# Patient Record
Sex: Male | Born: 2000 | Race: Black or African American | Hispanic: No | Marital: Single | Smoking: Never smoker
Health system: Southern US, Community
[De-identification: ages and names within clinical notes are randomized; demographics above are authoritative.]

## PROBLEM LIST (undated history)

## (undated) DIAGNOSIS — F79 Unspecified intellectual disabilities: Secondary | ICD-10-CM

## (undated) HISTORY — PX: EYE SURGERY: SHX253

---
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
## 2023-01-24 ENCOUNTER — Other Ambulatory Visit: Payer: Self-pay

## 2023-01-24 ENCOUNTER — Other Ambulatory Visit (HOSPITAL_BASED_OUTPATIENT_CLINIC_OR_DEPARTMENT_OTHER): Payer: Self-pay

## 2023-01-24 ENCOUNTER — Emergency Department (HOSPITAL_BASED_OUTPATIENT_CLINIC_OR_DEPARTMENT_OTHER)
Admission: EM | Admit: 2023-01-24 | Discharge: 2023-01-24 | Disposition: A | Discharge status: Home / Self Care | Payer: Medicare Other | Attending: Emergency Medicine | Admitting: Emergency Medicine

## 2023-01-24 ENCOUNTER — Emergency Department (HOSPITAL_BASED_OUTPATIENT_CLINIC_OR_DEPARTMENT_OTHER): Payer: Medicare Other

## 2023-01-24 DIAGNOSIS — R112 Nausea with vomiting, unspecified: Secondary | ICD-10-CM

## 2023-01-24 DIAGNOSIS — K8020 Calculus of gallbladder without cholecystitis without obstruction: Secondary | ICD-10-CM | POA: Insufficient documentation

## 2023-01-24 LAB — COMPREHENSIVE METABOLIC PANEL
ALT: 12 U/L (ref 0–44)
AST: 20 U/L (ref 15–41)
Albumin: 4.5 g/dL (ref 3.5–5.0)
Alkaline Phosphatase: 140 U/L — ABNORMAL HIGH (ref 38–126)
Anion gap: 7 (ref 5–15)
BUN: 10 mg/dL (ref 6–20)
CO2: 28 mmol/L (ref 22–32)
Calcium: 9.5 mg/dL (ref 8.9–10.3)
Chloride: 101 mmol/L (ref 98–111)
Creatinine, Ser: 0.98 mg/dL (ref 0.61–1.24)
GFR, Estimated: >60 mL/min (ref >60)
Glucose, Bld: 91 mg/dL (ref 70–99)
Potassium: 4.8 mmol/L (ref 3.5–5.1)
Sodium: 136 mmol/L (ref 135–145)
Total Bilirubin: 1.7 mg/dL — ABNORMAL HIGH (ref <1.2)
Total Protein: 7.6 g/dL (ref 6.5–8.1)

## 2023-01-24 LAB — CBC WITH DIFFERENTIAL/PLATELET
Abs Immature Granulocytes: 0.01 10*3/uL (ref 0.00–0.07)
Basophils Absolute: 0 10*3/uL (ref 0.0–0.1)
Basophils Relative: 0 %
Eosinophils Absolute: 0 10*3/uL (ref 0.0–0.5)
Eosinophils Relative: 0 %
HCT: 45.9 % (ref 39.0–52.0)
Hemoglobin: 16.1 g/dL (ref 13.0–17.0)
Immature Granulocytes: 0 %
Lymphocytes Relative: 12 %
Lymphs Abs: 0.7 10*3/uL (ref 0.7–4.0)
MCH: 30.3 pg (ref 26.0–34.0)
MCHC: 35.1 g/dL (ref 30.0–36.0)
MCV: 86.3 fL (ref 80.0–100.0)
Monocytes Absolute: 0.3 10*3/uL (ref 0.1–1.0)
Monocytes Relative: 5 %
Neutro Abs: 5.1 10*3/uL (ref 1.7–7.7)
Neutrophils Relative %: 83 %
Platelets: 240 10*3/uL (ref 150–400)
RBC: 5.32 MIL/uL (ref 4.22–5.81)
RDW: 11.7 % (ref 11.5–15.5)
WBC: 6.2 10*3/uL (ref 4.0–10.5)
nRBC: 0 % (ref 0.0–0.2)

## 2023-01-24 LAB — LIPASE, BLOOD: Lipase: 15 U/L (ref 11–51)

## 2023-01-24 MED ORDER — TRAMADOL HCL 50 MG PO TABS
50.0000 mg | ORAL_TABLET | Freq: Four times a day (QID) | ORAL | 0 refills | Status: DC | PRN
  Filled 2023-01-24: qty 15, 4d supply, fill #0

## 2023-01-24 MED ORDER — KETOROLAC TROMETHAMINE 30 MG/ML IJ SOLN
30.0000 mg | Freq: Once | INTRAMUSCULAR | Status: AC
  Administered 2023-01-24: 30 mg via INTRAVENOUS
  Filled 2023-01-24: qty 1

## 2023-01-24 MED ORDER — ONDANSETRON HCL 4 MG PO TABS
4.0000 mg | ORAL_TABLET | Freq: Four times a day (QID) | ORAL | 0 refills | Status: DC
  Filled 2023-01-24: qty 10, 3d supply, fill #0

## 2023-01-24 MED ORDER — ONDANSETRON HCL 4 MG/2ML IJ SOLN
4.0000 mg | Freq: Once | INTRAMUSCULAR | Status: AC
  Administered 2023-01-24: 4 mg via INTRAVENOUS
  Filled 2023-01-24: qty 2

## 2023-01-24 MED ORDER — ONDANSETRON HCL 4 MG/5ML PO SOLN: 4.0000 mg | Freq: Once | ORAL | 0 refills | Status: AC

## 2023-01-24 NOTE — ED Provider Notes (Signed)
[name] Provider Note   CSN: [id number] Arrival date & time: 01/24/23  1059     History  Chief Complaint  Patient presents with   Vomiting    Adam Black is a 22 y.o. male with autism presents accompanied by his mother with complaints of epigastric pain and vomiting that started this morning.  He was evaluated at urgent care this morning.  His lab work was notable for mildly elevated total bili of 1.7.  Influenza, COVID and strep all noted to be negative.  He was referred here for concern of gallbladder pathology.  Denies any cough or congestion.  No chest pain or shortness of breath.  No one at home is sick.  No diarrhea.  No prior abdominal surgeries.  HPI     Home Medications Prior to Admission medications   Medication Sig Start Date End Date Taking? Authorizing Provider  ondansetron (ZOFRAN) 4 MG tablet Take 1 tablet (4 mg total) by mouth every 6 (six) hours. 01/24/23  Yes Halford Decamp, PA-C  traMADol (ULTRAM) 50 MG tablet Take 1 tablet (50 mg total) by mouth every 6 (six) hours as needed. 01/24/23  Yes Halford Decamp, PA-C  albuterol (ACCUNEB) 0.63 MG/3ML nebulizer solution Take 1 ampule by nebulization every 6 (six) hours as needed.      [provider]  azithromycin (ZITHROMAX) 200 MG/5ML suspension 8 ml today, then 4 ml po once daily x 4 days 02/07/11   Esperanza Sheets M, PA-C  Cetirizine HCl (ZYRTEC) 5 MG/5ML SYRP Take 5 mg by mouth daily.      [provider]      Allergies    Amoxicillin-pot clavulanate    Review of Systems   Review of Systems  Gastrointestinal:  Positive for abdominal pain, nausea and vomiting.    Physical Exam Updated Vital Signs BP 120/80   Pulse 66   Temp 98.2 F (36.8 C) (Oral)   Resp 18   Ht 5\' 4"  (1.626 m)   Wt 74.8 kg   SpO2 100%   BMI 28.32 kg/m  Physical Exam Vitals and nursing note reviewed.  Constitutional:      General: He is not in acute  distress.    Appearance: He is well-developed.  HENT:     Head: Normocephalic and atraumatic.     Right Ear: Tympanic membrane normal.     Left Ear: Tympanic membrane normal.     Mouth/Throat:     Pharynx: No oropharyngeal exudate or posterior oropharyngeal erythema.  Eyes:     Conjunctiva/sclera: Conjunctivae normal.  Cardiovascular:     Rate and Rhythm: Normal rate and regular rhythm.     Heart sounds: No murmur heard. Pulmonary:     Effort: Pulmonary effort is normal. No respiratory distress.     Breath sounds: Normal breath sounds.  Abdominal:     Palpations: Abdomen is soft.     Comments: Notable tenderness to epigastric, mild periumbilical tenderness, no peritoneal signs, no rebound  Musculoskeletal:        General: No swelling.     Cervical back: Neck supple.  Skin:    General: Skin is warm and dry.     Capillary Refill: Capillary refill takes less than 2 seconds.  Neurological:     Mental Status: He is alert.  Psychiatric:        Mood and Affect: Mood normal.     ED Results / Procedures / Treatments   Labs (all labs  ordered are listed, but only abnormal results are displayed) Labs Reviewed  COMPREHENSIVE METABOLIC PANEL - Abnormal; Notable for the following components:      Result Value   Alkaline Phosphatase 140 (*)    Total Bilirubin 1.7 (*)    All other components within normal limits  CBC WITH DIFFERENTIAL/PLATELET  LIPASE, BLOOD    EKG None  Radiology US Abdomen Limited RUQ (LIVER/GB) Result Date: 01/24/2023 CLINICAL DATA:  151470 RUQ abdominal pain 151470 elevated bilirubin, sore throat, multiple vomiting episodes today. EXAM: ULTRASOUND ABDOMEN LIMITED RIGHT UPPER QUADRANT COMPARISON:  Chest XR, 03/31/2020. FINDINGS: Gallbladder: Solitary 0.5 cm dependent and mobile echogenic gallstone within a nondistended gallbladder. No pericholecystic fluid or wall thickening visualized. No sonographic Murphy sign noted by sonographer. Common bile duct: Diameter:  0.1 cm Liver: No focal lesion identified. Within normal limits in parenchymal echogenicity. Portal vein is patent on color Doppler imaging with normal direction of blood flow towards the liver. Other: 2.2 x 1.7 x 1.7 cm exophytic anechoic, thin-walled lesion with without internal vascularity at the RIGHT superior pole, consistent with a simple renal cyst. IMPRESSION: 1. Small, mobile echogenic gallstone within a nondistended gallbladder. No additional findings to suggest acute cholecystitis. This can, however, cause intermittent ball-valve obstruction and result in the patient's reported symptoms. 2. Incidental 2 cm RIGHT superior renal pole cyst. No dedicated follow-up is recommended. Electronically Signed   By: Roanna Banning M.D.   On: 01/24/2023 16:05    Procedures Procedures    Medications Ordered in ED Medications  ondansetron (ZOFRAN) injection 4 mg (4 mg Intravenous Given 01/24/23 1203)  ketorolac (TORADOL) 30 MG/ML injection 30 mg (30 mg Intravenous Given 01/24/23 1203)    ED Course/ Medical Decision Making/ A&P                                 Medical Decision Making  This patient presents to the ED with chief complaint(s) of abdominal pain and nausea and vomiting.  The complaint involves an extensive differential diagnosis and also carries with it a high risk of complications and morbidity.   pertinent past medical history as listed in HPI  The differential diagnosis includes  URI, cholecystitis, pancreatitis, appendicitis, gastroenteritis The initial plan is to  Repeat labs, ultrasound right upper quadrant, antiemetics Additional history obtained: Additional history obtained from family Records reviewed Care Everywhere/External Records  Initial Assessment:   Patient is hemodynamically stable, afebrile.  Abdominal exam notable for epigastric and periumbilical tenderness.  Referred here had a concern of gallbladder etiology given slightly elevated total bilirubin.  He has no  right lower quadrant tenderness to suggest appendicitis.  No diarrhea to suggest gastroenteritis.  No cough, congestion, ear pain to suggest any viral etiology.  Independent ECG interpretation:  None  Independent labs interpretation:  The following labs were independently interpreted:  CBC without leukocytosis  Independent visualization and interpretation of imaging: I independently visualized the following imaging with scope of interpretation limited to determining acute life threatening conditions related to emergency care: Right upper quadrant ultrasound, which revealed cholelithiasis without signs of cholecystitis or obstruction  Treatment and Reassessment: Following first assessment patient given Zofran IV 4 mg for nausea and vomiting and IV Toradol for pain.  3 PM patient reports improvement in pain and nausea.  States he would like to return home.  Abdominal exam notable for now mild epigastric and periumbilical tenderness.  Will p.o. challenge.  Upon reassessment patient has  had a successful p.o. challenge.  States he is able to return home.  Discussed ultrasound findings with patient and his mother as well as discharge plan.  They are agreeable. Consultations obtained:   none  Disposition:   Patient will be discharged home.  Referral provided for general surgery outpatient.  Prescription of tramadol and Zofran sent into pharmacy.  The patient has been appropriately medically screened and/or stabilized in the ED. I have low suspicion for any other emergent medical condition which would require further screening, evaluation or treatment in the ED or require inpatient management. At time of discharge the patient is hemodynamically stable and in no acute distress. I have discussed work-up results and diagnosis with patient and answered all questions. Patient is agreeable with discharge plan. We discussed strict return precautions for returning to the emergency department and they  verbalized understanding.     Social Determinants of Health:   none  This note was dictated with voice recognition software.  Despite best efforts at proofreading, errors may have occurred which can change the documentation meaning.          Final Clinical Impression(s) / ED Diagnoses Final diagnoses:  Nausea and vomiting, unspecified vomiting type  Calculus of gallbladder without cholecystitis without obstruction    Rx / DC Orders ED Discharge Orders          Ordered    ondansetron (ZOFRAN) 4 MG tablet  Every 6 hours        01/24/23 1710    traMADol (ULTRAM) 50 MG tablet  Every 6 hours PRN        01/24/23 1710              Fabienne Bruns 01/24/23 1711    Benjiman Core, MD 01/27/23 1441

## 2023-01-24 NOTE — ED Triage Notes (Signed)
Pt reports epigastric pain and vomiting that started this morning.  Mother states he had blood work done this AM and bilirubin was elevated.   Resp and strep was negative this AM

## 2023-01-24 NOTE — ED Notes (Addendum)
Pt given water and saltine crackers for PO challenge. Pt tolerated well.

## 2023-01-24 NOTE — Discharge Instructions (Addendum)
It was a pleasure taking care of you today.  You were evaluated the emergency room for abdominal pain and vomiting.  You are given medication for pain and nausea.  Your ultrasound showed a small stone in your gallbladder without evidence of infection of your gallbladder.  You were provided a referral for a general surgeon for evaluation please make an appointment within the next week.  A prescription for antinausea medication was sent into your pharmacy.  Please additionally use Tylenol as needed for pain.  If you experience any new or worsening symptoms including worsening abdominal pain, persistent vomiting, fevers and chills please return to the emergency room

## 2023-01-24 NOTE — ED Notes (Signed)
Reviewed discharge instructions, medications, and home care with pt and mom. Pt's mother verbalized understanding and had no further questions. Pt exited ED without complications.

## 2023-06-12 ENCOUNTER — Emergency Department (HOSPITAL_BASED_OUTPATIENT_CLINIC_OR_DEPARTMENT_OTHER)

## 2023-06-12 ENCOUNTER — Encounter (HOSPITAL_BASED_OUTPATIENT_CLINIC_OR_DEPARTMENT_OTHER): Payer: Self-pay | Admitting: Emergency Medicine

## 2023-06-12 ENCOUNTER — Emergency Department (HOSPITAL_BASED_OUTPATIENT_CLINIC_OR_DEPARTMENT_OTHER): Admitting: Radiology

## 2023-06-12 ENCOUNTER — Other Ambulatory Visit: Payer: Self-pay

## 2023-06-12 ENCOUNTER — Emergency Department (HOSPITAL_BASED_OUTPATIENT_CLINIC_OR_DEPARTMENT_OTHER)
Admission: EM | Admit: 2023-06-12 | Discharge: 2023-06-12 | Disposition: A | Discharge status: Home / Self Care | Attending: Emergency Medicine | Admitting: Emergency Medicine

## 2023-06-12 DIAGNOSIS — J45909 Unspecified asthma, uncomplicated: Secondary | ICD-10-CM | POA: Insufficient documentation

## 2023-06-12 DIAGNOSIS — R509 Fever, unspecified: Secondary | ICD-10-CM | POA: Insufficient documentation

## 2023-06-12 DIAGNOSIS — R051 Acute cough: Secondary | ICD-10-CM | POA: Diagnosis not present

## 2023-06-12 DIAGNOSIS — R1013 Epigastric pain: Secondary | ICD-10-CM | POA: Diagnosis not present

## 2023-06-12 DIAGNOSIS — R112 Nausea with vomiting, unspecified: Secondary | ICD-10-CM | POA: Insufficient documentation

## 2023-06-12 DIAGNOSIS — D72829 Elevated white blood cell count, unspecified: Secondary | ICD-10-CM | POA: Insufficient documentation

## 2023-06-12 LAB — CBC WITH DIFFERENTIAL/PLATELET
Abs Immature Granulocytes: 0.03 10*3/uL (ref 0.00–0.07)
Basophils Absolute: 0 10*3/uL (ref 0.0–0.1)
Basophils Relative: 0 %
Eosinophils Absolute: 0 10*3/uL (ref 0.0–0.5)
Eosinophils Relative: 0 %
HCT: 42.6 % (ref 39.0–52.0)
Hemoglobin: 15.1 g/dL (ref 13.0–17.0)
Immature Granulocytes: 0 %
Lymphocytes Relative: 8 %
Lymphs Abs: 1 10*3/uL (ref 0.7–4.0)
MCH: 30.7 pg (ref 26.0–34.0)
MCHC: 35.4 g/dL (ref 30.0–36.0)
MCV: 86.6 fL (ref 80.0–100.0)
Monocytes Absolute: 0.9 10*3/uL (ref 0.1–1.0)
Monocytes Relative: 8 %
Neutro Abs: 9.8 10*3/uL — ABNORMAL HIGH (ref 1.7–7.7)
Neutrophils Relative %: 84 %
Platelets: 216 10*3/uL (ref 150–400)
RBC: 4.92 MIL/uL (ref 4.22–5.81)
RDW: 12.1 % (ref 11.5–15.5)
WBC: 11.8 10*3/uL — ABNORMAL HIGH (ref 4.0–10.5)
nRBC: 0 % (ref 0.0–0.2)

## 2023-06-12 LAB — URINALYSIS, ROUTINE W REFLEX MICROSCOPIC
Bilirubin Urine: NEGATIVE
Glucose, UA: NEGATIVE mg/dL
Hgb urine dipstick: NEGATIVE
Ketones, ur: 15 mg/dL — AB
Leukocytes,Ua: NEGATIVE
Nitrite: NEGATIVE
Protein, ur: NEGATIVE mg/dL
Specific Gravity, Urine: 1.015 (ref 1.005–1.030)
pH: 6.5 (ref 5.0–8.0)

## 2023-06-12 LAB — COMPREHENSIVE METABOLIC PANEL WITH GFR
ALT: 13 U/L (ref 0–44)
AST: 25 U/L (ref 15–41)
Albumin: 4.5 g/dL (ref 3.5–5.0)
Alkaline Phosphatase: 133 U/L — ABNORMAL HIGH (ref 38–126)
Anion gap: 12 (ref 5–15)
BUN: 8 mg/dL (ref 6–20)
CO2: 24 mmol/L (ref 22–32)
Calcium: 9.5 mg/dL (ref 8.9–10.3)
Chloride: 99 mmol/L (ref 98–111)
Creatinine, Ser: 1.06 mg/dL (ref 0.61–1.24)
GFR, Estimated: >60 mL/min (ref >60)
Glucose, Bld: 98 mg/dL (ref 70–99)
Potassium: 4.3 mmol/L (ref 3.5–5.1)
Sodium: 135 mmol/L (ref 135–145)
Total Bilirubin: 1.2 mg/dL (ref 0.0–1.2)
Total Protein: 7.1 g/dL (ref 6.5–8.1)

## 2023-06-12 LAB — RESP PANEL BY RT-PCR (RSV, FLU A&B, COVID)  RVPGX2
Influenza A by PCR: NEGATIVE
Influenza B by PCR: NEGATIVE
Resp Syncytial Virus by PCR: NEGATIVE
SARS Coronavirus 2 by RT PCR: NEGATIVE

## 2023-06-12 LAB — LIPASE, BLOOD: Lipase: 14 U/L (ref 11–51)

## 2023-06-12 MED ORDER — BENZONATATE 100 MG PO CAPS
100.0000 mg | ORAL_CAPSULE | Freq: Once | ORAL | Status: DC
  Filled 2023-06-12: qty 1

## 2023-06-12 MED ORDER — GUAIFENESIN 100 MG/5ML PO LIQD
5.0000 mL | Freq: Once | ORAL | Status: AC
  Administered 2023-06-12: 5 mL via ORAL
  Filled 2023-06-12: qty 10

## 2023-06-12 MED ORDER — ACETAMINOPHEN 160 MG/5ML PO SOLN
650.0000 mg | Freq: Once | ORAL | Status: AC
Start: 2023-06-12 — End: 2023-06-12
  Administered 2023-06-12: 650 mg via ORAL
  Filled 2023-06-12: qty 20.3

## 2023-06-12 MED ORDER — ONDANSETRON 4 MG PO TBDP: 4.0000 mg | ORAL_TABLET | Freq: Three times a day (TID) | ORAL | 0 refills | Status: AC | PRN

## 2023-06-12 NOTE — ED Provider Notes (Signed)
 [place] EM[facility]GENCY DEPARTMENT AT [facility] Provider Note   CSN: [id number] Arrival date & time: 06/12/23  1551     History  Chief Complaint  Patient presents with   Abdominal Pain    Adam Black is a 23 y.o. male.  HPI     23 year old male with a history of cerebral palsy, intellectual disability (mother has guardianship), cholelithiasis, who presents with concern for cough and abdominal pain.  Coughing, classmates have been sick too. 3-4 days ago began.  Gave him mucinex  this morning Vomited 3 times today Then saying having RUQ abdominal pain, headache, chills 100.3 temperature at UC No diarrhea or constipation No difficulty breathing No chest pain Epigastric abdominal pain No pain or swelling in legs Some congestion with cough Does have asthma but not wheezing No problems urinating  He is ambulatory. Not bed ridden, no immobilization, recent surgeries, no hx of DVT or PE. No hemoptysis  Past Medical History:  Diagnosis Date   Asthma    Mental retardation    Past Surgical History:  Procedure Laterality Date   EYE SURGERY        Home Medications Prior to Admission medications   Medication Sig Start Date End Date Taking? Authorizing Provider  ondansetron  (ZOFRAN -ODT) 4 MG disintegrating tablet Take 1 tablet (4 mg total) by mouth every 8 (eight) hours as needed for nausea or vomiting. 06/12/23  Yes Scarlette Currier, MD  albuterol  (ACCUNEB ) 0.63 MG/3ML nebulizer solution Take 1 ampule by nebulization every 6 (six) hours as needed.      [provider]  azithromycin  (ZITHROMAX ) 200 MG/5ML suspension 8 ml today, then 4 ml po once daily x 4 days 02/07/11   Cindra Cree M, PA-C  Cetirizine HCl (ZYRTEC) 5 MG/5ML SYRP Take 5 mg by mouth daily.      [provider]      Allergies    Amoxicillin-pot clavulanate    Review of Systems   Review of Systems  Physical Exam Updated Vital Signs BP 115/82   Pulse (!) 102   Temp (!) 101 F  (38.3 C) (Oral)   Resp 20   Ht 5\' 4"  (1.626 m)   Wt 75.8 kg   SpO2 100%   BMI 28.67 kg/m  Physical Exam Vitals and nursing note reviewed.  Constitutional:      General: He is not in acute distress.    Appearance: He is well-developed. He is not diaphoretic.  HENT:     Head: Normocephalic and atraumatic.  Eyes:     Conjunctiva/sclera: Conjunctivae normal.  Cardiovascular:     Rate and Rhythm: Normal rate and regular rhythm.     Heart sounds: Normal heart sounds. No murmur heard.    No friction rub. No gallop.  Pulmonary:     Effort: Pulmonary effort is normal. No respiratory distress.     Breath sounds: Normal breath sounds. No wheezing or rales.  Abdominal:     General: There is no distension.     Palpations: Abdomen is soft.     Tenderness: There is abdominal tenderness in the right upper quadrant and epigastric area. There is no guarding. Negative signs include McBurney's sign. Murphy's sign: reports tenderness but not techincally positive. Musculoskeletal:     Cervical back: Normal range of motion.  Skin:    General: Skin is warm and dry.  Neurological:     Mental Status: He is alert and oriented to person, place, and time.     ED Results / Procedures /  Treatments   Labs (all labs ordered are listed, but only abnormal results are displayed) Labs Reviewed  CBC WITH DIFFERENTIAL/PLATELET - Abnormal; Notable for the following components:      Result Value   WBC 11.8 (*)    Neutro Abs 9.8 (*)    All other components within normal limits  COMPREHENSIVE METABOLIC PANEL WITH GFR - Abnormal; Notable for the following components:   Alkaline Phosphatase 133 (*)    All other components within normal limits  URINALYSIS, ROUTINE W REFLEX MICROSCOPIC - Abnormal; Notable for the following components:   Ketones, ur 15 (*)    All other components within normal limits  RESP PANEL BY RT-PCR (RSV, FLU A&B, COVID)  RVPGX2  LIPASE, BLOOD    EKG EKG  Interpretation Date/Time:  Thursday Jun 12 2023 16:18:07 EDT Ventricular Rate:  89 PR Interval:  146 QRS Duration:  86 QT Interval:  342 QTC Calculation: 417 R Axis:   107  Text Interpretation: Sinus rhythm Borderline right axis deviation Borderline ST elevation, anterolateral leads No significant change since last tracing Confirmed by Scarlette Currier ([id number]) on 06/12/2023 4:56:08 PM  Radiology US  Abdomen Limited RUQ (LIVER/GB) Result Date: 06/12/2023 CLINICAL DATA:  151470 RUQ abdominal pain 151470 EXAM: ULTRASOUND ABDOMEN LIMITED RIGHT UPPER QUADRANT COMPARISON:  Ultrasound abdomen 01/24/2023 FINDINGS: Gallbladder: Mobile gallstone within the gallbladder lumen. No gallbladder wall thickening or pericholecystic visualized. No sonographic Murphy sign noted by sonographer. Common bile duct: Diameter: 3 mm Liver: No focal lesion identified. Within normal limits in parenchymal echogenicity. Portal vein is patent on color Doppler imaging with normal direction of blood flow towards the liver. Other: None. IMPRESSION: Cholelithiasis with no acute cholecystitis. Electronically Signed   By: Morgane  Naveau M.D.   On: 06/12/2023 20:25   DG Chest 2 View Result Date: 06/12/2023 CLINICAL DATA:  Cough. EXAM: CHEST - 2 VIEW COMPARISON:  March 31, 2020. FINDINGS: The heart size and mediastinal contours are within normal limits. Both lungs are clear. The visualized skeletal structures are unremarkable. IMPRESSION: No active cardiopulmonary disease. Electronically Signed   By: Rosalene Colon M.D.   On: 06/12/2023 17:28    Procedures Procedures    Medications Ordered in ED Medications  benzonatate  (TESSALON ) capsule 100 mg (100 mg Oral Not Given 06/12/23 1750)  guaiFENesin  (ROBITUSSIN) 100 MG/5ML liquid 5 mL (5 mLs Oral Given 06/12/23 1753)  acetaminophen  (TYLENOL ) 160 MG/5ML solution 650 mg (650 mg Oral Given 06/12/23 2059)    ED Course/ Medical Decision Making/ A&P                                   23 year old male with a history of cerebral palsy, intellectual disability (mother has guardianship), cholelithiasis, who presents with concern for cough and abdominal pain.  Differential diagnosis includes viral respiratory infection, pneumonia, PE, pancreatitis, hepatitis, cholecystitis, gastritis, peptic ulcer disease, pyelonephritis.  Labs completed and personally evaluated interpreted by me show no signs of pancreatitis, hepatitis, mild leukocytosis.  Urinalysis is without urinary tract infection.  COVID, flu and RSV testing are negative.    Chest x-ray completed and personally by me and radiology shows no evidence of pneumonia, pneumothorax, or pulmonary edema.   Right upper quadrant ultrasound completed showing cholelithiasis without cholecystitis.  Discussed that with sick contacts at school with cough, it is possible he has a viral infection with cough, fever, nausea, vomiting and abdominal discomfort from gastritis or muscular pain from coughing.  Also discussed ddx does also include occult pneumonia, PE (although low suspicion with him being PERC negative/low risk [place]) or early cholecystitis (although no GB wall thickening, no significant tenderness on exam, no pericholecystic fluid).  Discussed we can obtain CTA PE study, ct abdomen pelvis and or discuss with general surgery for evaluation, however in setting of cough/sick contacts can continue supportive care at this time and return for new or worsening symptoms. Agree to conservative treatment at this time. Given zofran  for nausea, she will continue OTC cough medications.   Discussed very strict return precautions.  Patient discharged in stable condition with understanding of reasons to return.         Final Clinical Impression(s) / ED Diagnoses Final diagnoses:  Acute cough  Fever, unspecified fever cause  Nausea and vomiting, unspecified vomiting type  Epigastric pain    Rx / DC Orders ED Discharge Orders           Ordered    ondansetron  (ZOFRAN -ODT) 4 MG disintegrating tablet  Every 8 hours PRN        06/12/23 2101              Scarlette Currier, MD 06/12/23 2104

## 2023-06-12 NOTE — ED Triage Notes (Addendum)
 Pt alert, ambulatory, NAD from UC c/o RUQ and epigastric abd pain with N/V and chills that started today, pt reports non-productive cough x3 days.   Zofran  admin at [facility].

## 2023-06-12 NOTE — ED Notes (Signed)
 Mother asking when they can leave, explained that waiting on US  results. Edp schlossman notified

## 2023-06-12 NOTE — ED Notes (Signed)

## 2023-06-12 NOTE — Discharge Instructions (Addendum)
 Can take mucinex  at 10PM (after cough medication here wears off.)

## 2023-06-14 ENCOUNTER — Emergency Department (HOSPITAL_BASED_OUTPATIENT_CLINIC_OR_DEPARTMENT_OTHER): Admitting: Radiology

## 2023-06-14 ENCOUNTER — Other Ambulatory Visit: Payer: Self-pay

## 2023-06-14 ENCOUNTER — Emergency Department (HOSPITAL_BASED_OUTPATIENT_CLINIC_OR_DEPARTMENT_OTHER)
Admission: EM | Admit: 2023-06-14 | Discharge: 2023-06-14 | Disposition: A | Discharge status: Home / Self Care | Attending: Emergency Medicine | Admitting: Emergency Medicine

## 2023-06-14 ENCOUNTER — Encounter (HOSPITAL_BASED_OUTPATIENT_CLINIC_OR_DEPARTMENT_OTHER): Payer: Self-pay

## 2023-06-14 DIAGNOSIS — J168 Pneumonia due to other specified infectious organisms: Secondary | ICD-10-CM | POA: Diagnosis not present

## 2023-06-14 DIAGNOSIS — J45909 Unspecified asthma, uncomplicated: Secondary | ICD-10-CM | POA: Insufficient documentation

## 2023-06-14 DIAGNOSIS — R059 Cough, unspecified: Secondary | ICD-10-CM | POA: Diagnosis present

## 2023-06-14 DIAGNOSIS — J189 Pneumonia, unspecified organism: Secondary | ICD-10-CM

## 2023-06-14 LAB — BASIC METABOLIC PANEL WITH GFR
Anion gap: 10 (ref 5–15)
BUN: 10 mg/dL (ref 6–20)
CO2: 28 mmol/L (ref 22–32)
Calcium: 9.5 mg/dL (ref 8.9–10.3)
Chloride: 100 mmol/L (ref 98–111)
Creatinine, Ser: 1.18 mg/dL (ref 0.61–1.24)
GFR, Estimated: >60 mL/min (ref >60)
Glucose, Bld: 96 mg/dL (ref 70–99)
Potassium: 4.3 mmol/L (ref 3.5–5.1)
Sodium: 139 mmol/L (ref 135–145)

## 2023-06-14 LAB — CBC WITH DIFFERENTIAL/PLATELET
Abs Immature Granulocytes: 0.02 10*3/uL (ref 0.00–0.07)
Basophils Absolute: 0 10*3/uL (ref 0.0–0.1)
Basophils Relative: 1 %
Eosinophils Absolute: 0.1 10*3/uL (ref 0.0–0.5)
Eosinophils Relative: 2 %
HCT: 44.5 % (ref 39.0–52.0)
Hemoglobin: 15.6 g/dL (ref 13.0–17.0)
Immature Granulocytes: 0 %
Lymphocytes Relative: 12 %
Lymphs Abs: 0.9 10*3/uL (ref 0.7–4.0)
MCH: 30.9 pg (ref 26.0–34.0)
MCHC: 35.1 g/dL (ref 30.0–36.0)
MCV: 88.1 fL (ref 80.0–100.0)
Monocytes Absolute: 1.1 10*3/uL — ABNORMAL HIGH (ref 0.1–1.0)
Monocytes Relative: 15 %
Neutro Abs: 5.2 10*3/uL (ref 1.7–7.7)
Neutrophils Relative %: 70 %
Platelets: 201 10*3/uL (ref 150–400)
RBC: 5.05 MIL/uL (ref 4.22–5.81)
RDW: 12.2 % (ref 11.5–15.5)
WBC: 7.4 10*3/uL (ref 4.0–10.5)
nRBC: 0 % (ref 0.0–0.2)

## 2023-06-14 LAB — RESP PANEL BY RT-PCR (RSV, FLU A&B, COVID)  RVPGX2
Influenza A by PCR: NEGATIVE
Influenza B by PCR: NEGATIVE
Resp Syncytial Virus by PCR: NEGATIVE
SARS Coronavirus 2 by RT PCR: NEGATIVE

## 2023-06-14 MED ORDER — CEFDINIR 250 MG/5ML PO SUSR: 300.0000 mg | Freq: Two times a day (BID) | ORAL | 0 refills | Status: AC

## 2023-06-14 MED ORDER — AZITHROMYCIN 250 MG PO TABS: 250.0000 mg | ORAL_TABLET | Freq: Every day | ORAL | 0 refills | Status: DC

## 2023-06-14 MED ORDER — CEFDINIR 300 MG PO CAPS: 300.0000 mg | ORAL_CAPSULE | Freq: Two times a day (BID) | ORAL | 0 refills | Status: DC

## 2023-06-14 MED ORDER — BENZONATATE 100 MG PO CAPS: 100.0000 mg | ORAL_CAPSULE | Freq: Three times a day (TID) | ORAL | 0 refills | Status: DC | PRN

## 2023-06-14 MED ORDER — AZITHROMYCIN 200 MG/5ML PO SUSR
250.0000 mg | Freq: Every day | ORAL | 0 refills | Status: AC
Start: 2023-06-14 — End: 2023-06-19

## 2023-06-14 MED ORDER — PROMETHAZINE-DM 6.25-15 MG/5ML PO SYRP: 2.5000 mL | ORAL_SOLUTION | Freq: Four times a day (QID) | ORAL | 0 refills | Status: AC | PRN

## 2023-06-14 NOTE — ED Provider Notes (Signed)
 [place] EMERGENCY DEPARTMENT AT [facility] Provider Note   CSN: [id number] Arrival date & time: 06/14/23  5643     History  Chief Complaint  Patient presents with   Fever    Riggs Britnell is a 23 y.o. male.   Fever   23 year old male presents emergency department accompanied by mother with complaints of cough, fever.  Patient has been ill with symptoms since around Tuesday this week.  Was seen in the ED 2 days ago and had reassuring workup including chest x-ray, labs, right upper quadrant ultrasound.  Did have mild leukocytosis at that time.  Mother reports patient continued with fever, cough, nasal congestion.  States that abdominal pain as well as nausea has completely resolved.  Presents due to continued symptoms.  Has been trying over-the-counter medications without significant improvement.  Last documented fever last night 101.  No fever this morning without antipyretic administered.  Past medical history significant for asthma, cerebral palsy, intellectual disability, cholelithiasis  Home Medications Prior to Admission medications   Medication Sig Start Date End Date Taking? Authorizing Provider  azithromycin  (ZITHROMAX ) 200 MG/5ML suspension Take 6.3 mLs (250 mg total) by mouth daily for 5 days. Take 500mg  (12.6 mg) first day and then 250 mg (6.3 mg) for the next four days. 06/14/23 06/19/23 Yes Neil Balls A, PA  cefdinir (OMNICEF) 250 MG/5ML suspension Take 6 mLs (300 mg total) by mouth 2 (two) times daily for 5 days. 06/14/23 06/19/23 Yes Neil Balls A, PA  promethazine-dextromethorphan (PROMETHAZINE-DM) 6.25-15 MG/5ML syrup Take 2.5 mLs by mouth 4 (four) times daily as needed for cough. 06/14/23  Yes Neil Balls A, PA  albuterol  (ACCUNEB ) 0.63 MG/3ML nebulizer solution Take 1 ampule by nebulization every 6 (six) hours as needed.      [provider]  Cetirizine HCl (ZYRTEC) 5 MG/5ML SYRP Take 5 mg by mouth daily.      [provider]   ondansetron  (ZOFRAN -ODT) 4 MG disintegrating tablet Take 1 tablet (4 mg total) by mouth every 8 (eight) hours as needed for nausea or vomiting. 06/12/23   Scarlette Currier, MD      Allergies    Amoxicillin-pot clavulanate    Review of Systems   Review of Systems  Constitutional:  Positive for fever.  All other systems reviewed and are negative.   Physical Exam Updated Vital Signs BP 131/85 (BP Location: Right Arm)   Pulse 92   Temp 99 F (37.2 C) (Oral)   Resp 18   Wt 75.8 kg   SpO2 97%   BMI 28.67 kg/m  Physical Exam Vitals and nursing note reviewed.  Constitutional:      General: He is not in acute distress.    Appearance: He is well-developed.  HENT:     Head: Normocephalic and atraumatic.     Right Ear: Tympanic membrane normal.     Left Ear: Tympanic membrane normal.     Nose: Congestion and rhinorrhea present.     Mouth/Throat:     Mouth: Mucous membranes are moist.     Pharynx: Oropharynx is clear.  Eyes:     Conjunctiva/sclera: Conjunctivae normal.  Cardiovascular:     Rate and Rhythm: Normal rate and regular rhythm.     Heart sounds: No murmur heard. Pulmonary:     Effort: Pulmonary effort is normal. No respiratory distress.     Breath sounds: Rales present.  Abdominal:     Palpations: Abdomen is soft.     Tenderness: There is no abdominal  tenderness. There is no guarding.  Musculoskeletal:        General: No swelling.     Cervical back: Neck supple.  Skin:    General: Skin is warm and dry.     Capillary Refill: Capillary refill takes less than 2 seconds.  Neurological:     Mental Status: He is alert.  Psychiatric:        Mood and Affect: Mood normal.     ED Results / Procedures / Treatments   Labs (all labs ordered are listed, but only abnormal results are displayed) Labs Reviewed  CBC WITH DIFFERENTIAL/PLATELET - Abnormal; Notable for the following components:      Result Value   Monocytes Absolute 1.1 (*)    All other components within  normal limits  RESP PANEL BY RT-PCR (RSV, FLU A&B, COVID)  RVPGX2  BASIC METABOLIC PANEL WITH GFR    EKG None  Radiology DG Chest 2 View Result Date: 06/14/2023 CLINICAL DATA:  23 year old male with fever and cough starting 3 days ago. EXAM: CHEST - 2 VIEW COMPARISON:  Chest radiographs 06/12/2023 and earlier. FINDINGS: PA and lateral views are a 954 hours. Lung volumes and mediastinal contours remain normal. Visualized tracheal air column is within normal limits. No pneumothorax, pulmonary edema, pleural effusion or consolidation. But there is mildly increased bilateral streaky perihilar opacity somewhat more apparent on the right. No acute osseous abnormality identified. Negative visible bowel gas. IMPRESSION: Constellation of clinical and imaging findings suspicious for patchy bilateral Bronchopneumonia. No consolidation or effusion at this time. Electronically Signed   By: Marlise Simpers M.D.   On: 06/14/2023 09:59   US  Abdomen Limited RUQ (LIVER/GB) Result Date: 06/12/2023 CLINICAL DATA:  [id number] RUQ abdominal pain [id number] EXAM: ULTRASOUND ABDOMEN LIMITED RIGHT UPPER QUADRANT COMPARISON:  Ultrasound abdomen 01/24/2023 FINDINGS: Gallbladder: Mobile gallstone within the gallbladder lumen. No gallbladder wall thickening or pericholecystic visualized. No sonographic Murphy sign noted by sonographer. Common bile duct: Diameter: 3 mm Liver: No focal lesion identified. Within normal limits in parenchymal echogenicity. Portal vein is patent on color Doppler imaging with normal direction of blood flow towards the liver. Other: None. IMPRESSION: Cholelithiasis with no acute cholecystitis. Electronically Signed   By: Morgane  Naveau M.D.   On: 06/12/2023 20:25   DG Chest 2 View Result Date: 06/12/2023 CLINICAL DATA:  Cough. EXAM: CHEST - 2 VIEW COMPARISON:  March 31, 2020. FINDINGS: The heart size and mediastinal contours are within normal limits. Both lungs are clear. The visualized skeletal structures are  unremarkable. IMPRESSION: No active cardiopulmonary disease. Electronically Signed   By: Rosalene Colon M.D.   On: 06/12/2023 17:28    Procedures Procedures    Medications Ordered in ED Medications - No data to display  ED Course/ Medical Decision Making/ A&P                                 Medical Decision Making Amount and/or Complexity of Data Reviewed Labs: ordered. Radiology: ordered.   This patient presents to the ED for concern of cough, fever, this involves an extensive number of treatment options, and is a complaint that carries with it a high risk of complications and morbidity.  The differential diagnosis includes COVID, flu, RSV, viral URI, pneumonia, sepsis, other   Co morbidities that complicate the patient evaluation  See HPI   Additional history obtained:  Additional history obtained from EMR External records from outside source obtained  and reviewed including hospital records   Lab Tests:  I Ordered, and personally interpreted labs.  The pertinent results include:  no leukocytosis. No anemia. No platelet abnormalities. Negative viral testing. No electrolyte abnormalities. No renal dysfunction.    Imaging Studies ordered:  I ordered imaging studies including chest x-ray I independently visualized and interpreted imaging which showed pathy bilateral bronchopneumonia I agree with the radiologist interpretation  Cardiac Monitoring: / EKG:  N/a   Consultations Obtained:  N/a   Problem List / ED Course / Critical interventions / Medication management  Pneumonia Reevaluation of the patient showed that the patient stayed the same I have reviewed the patients home medicines and have made adjustments as needed   Social Determinants of Health:  Denies tobacco,'s or drug use/exposure.   Test / Admission - Considered:  Pneumonia  Vitals signs within normal range and stable throughout visit. Laboratory/imaging studies significant for: See  above 23 year old male presents emergency department accompanied by mother with complaints of cough, fever.  Patient has been ill with symptoms since around Tuesday this week.  Was seen in the ED 2 days ago and had reassuring workup including chest x-ray, labs, right upper quadrant ultrasound.  Did have mild leukocytosis at that time.  Mother reports patient continued with fever, cough, nasal congestion.  States that abdominal pain as well as nausea has completely resolved.  Presents due to continued symptoms.  Has been trying over-the-counter medications without significant improvement.  Last documented fever last night 101.  No fever this morning without antipyretic administered. On exam, nontender abdomen.  Rales auscultated lung field.  Labs unremarkable for any acute emergent process.  Chest x-ray concerning for bronchopneumonia.  Patient does not meet SIRS criteria.  Suspect this is most likely etiology of patient's symptoms.  Will send antibiotics for treatment of CAP and recommend further symptomatic therapy as described in AVS.  Close follow-up with PCP in the outpatient setting recommended for reevaluation.  Treatment plan discussed with patient and mother and they know understanding were agreeable to said plan.  Patient overall well-appearing, afebrile in no acute distress. Worrisome signs and symptoms were discussed with the patient/mother, and they acknowledged understanding to return to the ED if noticed. Patient was stable upon discharge.          Final Clinical Impression(s) / ED Diagnoses Final diagnoses:  Pneumonia due to infectious organism, unspecified laterality, unspecified part of lung    Rx / DC Orders ED Discharge Orders     None         [place] Butter, [place] 06/14/23 1113    Lowery Rue, DO 06/14/23 1449

## 2023-06-14 NOTE — Discharge Instructions (Addendum)
 As discussed, x-ray concerning for pneumonia.  Will place on antibiotics for this.  Also sent in medicine for cough to use.  Recommend follow-up with your primary care for reassessment.  Please not hesitate to return if the worrisome signs and symptoms we discussed to become apparent.

## 2023-06-14 NOTE — ED Triage Notes (Signed)
 Patient presents with mother c/o fever that started 3 days ago +sick contacts. Evaluated on Thursday. Denies chest pain, shortness of breath

## 2023-06-29 ENCOUNTER — Encounter (HOSPITAL_BASED_OUTPATIENT_CLINIC_OR_DEPARTMENT_OTHER): Payer: Self-pay

## 2023-06-29 ENCOUNTER — Emergency Department (HOSPITAL_BASED_OUTPATIENT_CLINIC_OR_DEPARTMENT_OTHER)
Admission: EM | Admit: 2023-06-29 | Discharge: 2023-06-29 | Disposition: A | Discharge status: Home / Self Care | Attending: Emergency Medicine | Admitting: Emergency Medicine

## 2023-06-29 ENCOUNTER — Emergency Department (HOSPITAL_BASED_OUTPATIENT_CLINIC_OR_DEPARTMENT_OTHER): Admitting: Radiology

## 2023-06-29 ENCOUNTER — Other Ambulatory Visit: Payer: Self-pay

## 2023-06-29 DIAGNOSIS — R052 Subacute cough: Secondary | ICD-10-CM | POA: Diagnosis not present

## 2023-06-29 DIAGNOSIS — J189 Pneumonia, unspecified organism: Secondary | ICD-10-CM | POA: Diagnosis present

## 2023-06-29 DIAGNOSIS — R509 Fever, unspecified: Secondary | ICD-10-CM | POA: Insufficient documentation

## 2023-06-29 DIAGNOSIS — R059 Cough, unspecified: Secondary | ICD-10-CM | POA: Diagnosis present

## 2023-06-29 LAB — CBC WITH DIFFERENTIAL/PLATELET
Abs Immature Granulocytes: 0.02 10*3/uL (ref 0.00–0.07)
Basophils Absolute: 0.1 10*3/uL (ref 0.0–0.1)
Basophils Relative: 1 %
Eosinophils Absolute: 0.1 10*3/uL (ref 0.0–0.5)
Eosinophils Relative: 1 %
HCT: 44.1 % (ref 39.0–52.0)
Hemoglobin: 15.3 g/dL (ref 13.0–17.0)
Immature Granulocytes: 0 %
Lymphocytes Relative: 21 %
Lymphs Abs: 1.3 10*3/uL (ref 0.7–4.0)
MCH: 30.5 pg (ref 26.0–34.0)
MCHC: 34.7 g/dL (ref 30.0–36.0)
MCV: 87.8 fL (ref 80.0–100.0)
Monocytes Absolute: 1.2 10*3/uL — ABNORMAL HIGH (ref 0.1–1.0)
Monocytes Relative: 19 %
Neutro Abs: 3.6 10*3/uL (ref 1.7–7.7)
Neutrophils Relative %: 58 %
Platelets: 239 10*3/uL (ref 150–400)
RBC: 5.02 MIL/uL (ref 4.22–5.81)
RDW: 12 % (ref 11.5–15.5)
WBC: 6.2 10*3/uL (ref 4.0–10.5)
nRBC: 0 % (ref 0.0–0.2)

## 2023-06-29 LAB — RESP PANEL BY RT-PCR (RSV, FLU A&B, COVID)  RVPGX2
Influenza A by PCR: NEGATIVE
Influenza B by PCR: NEGATIVE
Resp Syncytial Virus by PCR: NEGATIVE
SARS Coronavirus 2 by RT PCR: NEGATIVE

## 2023-06-29 LAB — COMPREHENSIVE METABOLIC PANEL WITH GFR
ALT: 13 U/L (ref 0–44)
AST: 30 U/L (ref 15–41)
Albumin: 4.1 g/dL (ref 3.5–5.0)
Alkaline Phosphatase: 142 U/L — ABNORMAL HIGH (ref 38–126)
Anion gap: 12 (ref 5–15)
BUN: 9 mg/dL (ref 6–20)
CO2: 26 mmol/L (ref 22–32)
Calcium: 9.7 mg/dL (ref 8.9–10.3)
Chloride: 100 mmol/L (ref 98–111)
Creatinine, Ser: 1.22 mg/dL (ref 0.61–1.24)
GFR, Estimated: >60 mL/min (ref >60)
Glucose, Bld: 93 mg/dL (ref 70–99)
Potassium: 4.5 mmol/L (ref 3.5–5.1)
Sodium: 138 mmol/L (ref 135–145)
Total Bilirubin: 0.7 mg/dL (ref 0.0–1.2)
Total Protein: 7.2 g/dL (ref 6.5–8.1)

## 2023-06-29 MED ORDER — FAMOTIDINE 40 MG/5ML PO SUSR: 20.0000 mg | Freq: Two times a day (BID) | ORAL | 0 refills | Status: AC

## 2023-06-29 MED ORDER — PROMETHAZINE-CODEINE 6.25-10 MG/5ML PO SYRP: 7.5000 mL | ORAL_SOLUTION | Freq: Four times a day (QID) | ORAL | 0 refills | Status: AC | PRN

## 2023-06-29 MED ORDER — FAMOTIDINE 20 MG PO TABS
20.0000 mg | ORAL_TABLET | Freq: Once | ORAL | Status: DC
  Filled 2023-06-29: qty 1

## 2023-06-29 MED ORDER — IPRATROPIUM-ALBUTEROL 0.5-2.5 (3) MG/3ML IN SOLN: 3.0000 mL | RESPIRATORY_TRACT | 0 refills | Status: AC | PRN

## 2023-06-29 MED ORDER — HYDROCODONE-ACETAMINOPHEN 7.5-325 MG/15ML PO SOLN
5.0000 mL | Freq: Once | ORAL | Status: DC
  Filled 2023-06-29: qty 15

## 2023-06-29 MED ORDER — IPRATROPIUM-ALBUTEROL 0.5-2.5 (3) MG/3ML IN SOLN
3.0000 mL | Freq: Once | RESPIRATORY_TRACT | Status: AC
  Administered 2023-06-29: 3 mL via RESPIRATORY_TRACT
  Filled 2023-06-29: qty 3

## 2023-06-29 NOTE — Discharge Instructions (Signed)
 1.  At this time the cause for your persistent cough and recurrent fevers is unclear.  You have had a repeat chest x-ray today and it looks improved compared to your previous chest x-ray.  However, as symptoms are persisting, make an appointment to see your doctor soon as possible.  You may need referral to a specialist for further testing. 2.  Sometimes gastroesophageal reflux can cause chronic cough.  Often times people do not experience the typical symptoms of burning or acid taste in the chest or mouth.  You have been prescribed a liquid form of Pepcid to take twice daily.  Take this twice daily for the next 2 weeks to see if your symptoms improve. 3.  Asthma can also cause chronic cough.  You have been prescribed a nebulizer therapy.  Use this every 4 hours as needed for cough or wheezing. 4.  Phenergan  with codeine is a cough suppressant.  You may use this for severe coughing episodes or nighttime use if you cannot sleep due to coughing. 5.  Return to the emergency department if you develop shortness of breath, weakness, inability to take your medications, general worsening or other concerning changes.

## 2023-06-29 NOTE — ED Provider Notes (Signed)
 [place] EMERGENCY DEPARTMENT AT [facility] Provider Note   CSN: [id number] Arrival date & time: 06/29/23  1103     History  No chief complaint on file.   Quan Silman is a 23 y.o. male.  HPI Patient has had a harsh cough for about 3 weeks.  Fevers have been waxing and waning.  Patient's mom reports at night he is having full body sweats and having to be changed because his sleep where is all wet.  Temperatures at home have been up to 101.  Patient was treated for pneumonia and completed antibiotics.  Zithromax  and Omnicef  combination started 5\3\25.  Patient reports that his stomach hurts when he coughs.  Denies chest pain.  He denies any other areas of pain.  No sore throat.  Patient is continue to eat.  He has not had vomiting.    Home Medications Prior to Admission medications   Medication Sig Start Date End Date Taking? Authorizing Provider  famotidine  (PEPCID ) 40 MG/5ML suspension Take 2.5 mLs (20 mg total) by mouth 2 (two) times daily. 06/29/23  Yes Maybree Riling, Bufford Carne, MD  ipratropium-albuterol  (DUONEB) 0.5-2.5 (3) MG/3ML SOLN Take 3 mLs by nebulization every 4 (four) hours as needed. 06/29/23  Yes Wynetta Heckle, MD  promethazine -codeine  (PHENERGAN  WITH CODEINE ) 6.25-10 MG/5ML syrup Take 7.5 mLs by mouth every 6 (six) hours as needed for cough. 06/29/23  Yes Wynetta Heckle, MD  albuterol  (ACCUNEB ) 0.63 MG/3ML nebulizer solution Take 1 ampule by nebulization every 6 (six) hours as needed.      [provider]  Cetirizine HCl (ZYRTEC) 5 MG/5ML SYRP Take 5 mg by mouth daily.      [provider]  ondansetron  (ZOFRAN -ODT) 4 MG disintegrating tablet Take 1 tablet (4 mg total) by mouth every 8 (eight) hours as needed for nausea or vomiting. 06/12/23   Scarlette Currier, MD  promethazine -dextromethorphan (PROMETHAZINE -DM) 6.25-15 MG/5ML syrup Take 2.5 mLs by mouth 4 (four) times daily as needed for cough. 06/14/23   [place] Butter, PA      Allergies     Amoxicillin-pot clavulanate    Review of Systems   Review of Systems  Physical Exam Updated Vital Signs BP 125/83 (BP Location: Right Arm)   Pulse 87   Temp 98.8 F (37.1 C) (Oral)   Resp 18   SpO2 98%  Physical Exam Constitutional:      Comments: Alert.  Frequent dry cough.  No respiratory distress.  HENT:     Head: Normocephalic and atraumatic.     Mouth/Throat:     Pharynx: Oropharynx is clear.  Eyes:     Extraocular Movements: Extraocular movements intact.  Cardiovascular:     Rate and Rhythm: Normal rate and regular rhythm.  Pulmonary:     Comments: Frequent dry cough.  No respiratory distress.  Breath sounds diminished at the bases. Abdominal:     General: There is no distension.     Palpations: Abdomen is soft.     Tenderness: There is no abdominal tenderness. There is no guarding.  Musculoskeletal:        General: No swelling or tenderness. Normal range of motion.     Right lower leg: No edema.     Left lower leg: No edema.  Skin:    General: Skin is warm and dry.  Neurological:     Comments: Patient is alert and verbally interactive.  Situationally appropriate.  Movement is all relatively coordinated.     ED Results / Procedures / Treatments  Labs (all labs ordered are listed, but only abnormal results are displayed) Labs Reviewed  RESPIRATORY PANEL BY PCR - Abnormal; Notable for the following components:      Result Value   Parainfluenza Virus 3 DETECTED (*)    All other components within normal limits  COMPREHENSIVE METABOLIC PANEL WITH GFR - Abnormal; Notable for the following components:   Alkaline Phosphatase 142 (*)    All other components within normal limits  CBC WITH DIFFERENTIAL/PLATELET - Abnormal; Notable for the following components:   Monocytes Absolute 1.2 (*)    All other components within normal limits  CULTURE, BLOOD (ROUTINE X 2)  CULTURE, BLOOD (ROUTINE X 2)  RESP PANEL BY RT-PCR (RSV, FLU A&B, COVID)  RVPGX2     EKG None  Radiology No results found.   Procedures Procedures    Medications Ordered in ED Medications  ipratropium-albuterol  (DUONEB) 0.5-2.5 (3) MG/3ML nebulizer solution 3 mL (3 mLs Nebulization Given 06/29/23 1322)    ED Course/ Medical Decision Making/ A&P                                 Medical Decision Making Amount and/or Complexity of Data Reviewed Labs: ordered. Radiology: ordered.  Risk Prescription drug management.   Patient presents as outlined.  He has history of cerebral palsy and has had recurrent respiratory illness for the past 3 to 4 weeks.  He has had a course of antibiotic therapy with no improvement.  Patient continues to have waxing waning fever.  Will proceed with repeat chest x-ray and lab work.  Will administer DuoNeb therapy for cough and decreased breath sounds in the bases.  Suspected bronchospasm.  Chest x-ray shows improvement per radiology review.  COVID and influenza testing negative.  Comprehensive metabolic panel normal except alk phos of 142.  CBC normal with normal differential except monocytes 1.2.  At this time, patient is stable.  Diagnostic workup would suggest improvement.  Patient has complex medical history of cerebral palsy.  Has had recent treatment for pneumonia completed.  Patient's mother reports there has not been an improvement.  Clinically patient does not show any hypoxia, significant tachycardia, objective respiratory distress.  At this time I do not think repeat antibiotics is indicated.  Patient will need close monitoring on outpatient basis for any signs of an advancing infection.  We did discuss other causes of recurrent or chronic cough such as reflux and aspiration.  I recommended a trial of Pepcid  for several weeks with follow-up with PCP as well as the possibility of asthma causing chronic cough and increasing use of nebulizer therapy and inhalers.  At this time hide cough suppressant for nighttime use.  Patient  discharged in good condition.        Final Clinical Impression(s) / ED Diagnoses Final diagnoses:  Subacute cough  Recurrent fever of unknown cause    Rx / DC Orders ED Discharge Orders          Ordered    promethazine -codeine  (PHENERGAN  WITH CODEINE ) 6.25-10 MG/5ML syrup  Every 6 hours PRN        06/29/23 1542    ipratropium-albuterol  (DUONEB) 0.5-2.5 (3) MG/3ML SOLN  Every 4 hours PRN        06/29/23 1542    famotidine  (PEPCID ) 40 MG/5ML suspension  2 times daily        06/29/23 1542  Wynetta Heckle, MD 07/02/23 [phone]

## 2023-06-29 NOTE — ED Triage Notes (Signed)
 Patient recently dx with pneumonia and completed ATB treatment. Reports continued fevers. Denies chest pain, shortness of breath

## 2023-06-29 NOTE — ED Notes (Signed)
 RT notified of request for eval..Adam Black

## 2023-06-29 NOTE — ED Notes (Signed)
 Provider informed of [name]

## 2023-06-29 NOTE — ED Notes (Signed)
 Discharge paperwork given and verbally understood.

## 2023-06-29 NOTE — ED Notes (Signed)
 All refused meds, provider informed.Adam AasAaron Black

## 2023-06-30 LAB — RESPIRATORY PANEL BY PCR

## 2023-07-02 ENCOUNTER — Emergency Department (HOSPITAL_BASED_OUTPATIENT_CLINIC_OR_DEPARTMENT_OTHER)
Admission: EM | Admit: 2023-07-02 | Discharge: 2023-07-02 | Disposition: A | Discharge status: Home / Self Care | Attending: Emergency Medicine | Admitting: Emergency Medicine

## 2023-07-02 ENCOUNTER — Other Ambulatory Visit: Payer: Self-pay

## 2023-07-02 ENCOUNTER — Encounter (HOSPITAL_BASED_OUTPATIENT_CLINIC_OR_DEPARTMENT_OTHER): Payer: Self-pay | Admitting: Emergency Medicine

## 2023-07-02 DIAGNOSIS — R6883 Chills (without fever): Secondary | ICD-10-CM | POA: Insufficient documentation

## 2023-07-02 NOTE — ED Triage Notes (Signed)
 Pt via pov from home with mother; reports that pt was seen 3 days ago and diagnosed with parainfluenza 3; has not improved, but has not worsened. Mother reports that she got axillary temp of 95.7 at home. Was called by pcp to check on him, and was told to come to ED because of that temp. Pt is 97.6  (skin) and 98.1 (oral) in triage. Pt alert & oriented, nad noted.

## 2023-07-02 NOTE — Discharge Instructions (Addendum)
 Evaluation today was overall reassuring.  Vital signs and his temperature was normal here.  Suspect he still having symptoms from recent para flu diagnosis.  Please continue to treat supportively at home.  If he has any issues with shortness of breath, chest pain, develops a fever, is more lethargic or any other concerning symptom please return to the ED for further evaluation.  Otherwise please follow-up with PCP.

## 2023-07-02 NOTE — ED Provider Notes (Signed)
 [place] EMERGENCY DEPARTMENT AT [facility] Provider Note   CSN: [id number] Arrival date & time: 07/02/23  1156     History  Chief Complaint  Patient presents with   Chills   HPI Jary Louvier is a 23 y.o. male with cerebral palsy and recent diagnosis of paraflu presenting for chills. He is accompanied by his mother who reports that earlier today she took an axillary temp that read 95.7 F at home. She called his PCP who advised for them to come here.  She states that he has still had a cough at home and some nasal congestion but otherwise acting his normal self.  Patient denies shortness of breath or chest pain.  HPI     Home Medications Prior to Admission medications   Medication Sig Start Date End Date Taking? Authorizing Provider  albuterol  (ACCUNEB ) 0.63 MG/3ML nebulizer solution Take 1 ampule by nebulization every 6 (six) hours as needed.      [provider]  Cetirizine HCl (ZYRTEC) 5 MG/5ML SYRP Take 5 mg by mouth daily.      [provider]  famotidine  (PEPCID ) 40 MG/5ML suspension Take 2.5 mLs (20 mg total) by mouth 2 (two) times daily. 06/29/23   Wynetta Heckle, MD  ipratropium-albuterol  (DUONEB) 0.5-2.5 (3) MG/3ML SOLN Take 3 mLs by nebulization every 4 (four) hours as needed. 06/29/23   Wynetta Heckle, MD  ondansetron  (ZOFRAN -ODT) 4 MG disintegrating tablet Take 1 tablet (4 mg total) by mouth every 8 (eight) hours as needed for nausea or vomiting. 06/12/23   Scarlette Currier, MD  promethazine -codeine  (PHENERGAN  WITH CODEINE ) 6.25-10 MG/5ML syrup Take 7.5 mLs by mouth every 6 (six) hours as needed for cough. 06/29/23   Wynetta Heckle, MD  promethazine -dextromethorphan (PROMETHAZINE -DM) 6.25-15 MG/5ML syrup Take 2.5 mLs by mouth 4 (four) times daily as needed for cough. 06/14/23   [place] Butter, PA      Allergies    Amoxicillin-pot clavulanate    Review of Systems   See HPI  Physical Exam Updated Vital Signs BP 111/86 (BP Location:  Right Arm)   Pulse 77   Temp 98.1 F (36.7 C) (Oral)   Resp 16   Ht 5\' 4"  (1.626 m)   Wt 75.8 kg   SpO2 100%   BMI 28.68 kg/m  Physical Exam Vitals and nursing note reviewed.  HENT:     Head: Normocephalic and atraumatic.     Mouth/Throat:     Mouth: Mucous membranes are moist.  Eyes:     General:        Right eye: No discharge.        Left eye: No discharge.     Conjunctiva/sclera: Conjunctivae normal.  Cardiovascular:     Rate and Rhythm: Normal rate and regular rhythm.     Pulses: Normal pulses.     Heart sounds: Normal heart sounds.  Pulmonary:     Effort: Pulmonary effort is normal.     Breath sounds: Normal breath sounds and air entry. No wheezing, rhonchi or rales.  Abdominal:     General: Abdomen is flat.     Palpations: Abdomen is soft.  Skin:    General: Skin is warm and dry.  Neurological:     General: No focal deficit present.  Psychiatric:        Mood and Affect: Mood normal.     ED Results / Procedures / Treatments   Labs (all labs ordered are listed, but only abnormal results are displayed) Labs Reviewed -  No data to display  EKG None  Radiology No results found.  Procedures Procedures    Medications Ordered in ED Medications - No data to display  ED Course/ Medical Decision Making/ A&P                                 Medical Decision Making  23 year old well-appearing male presenting for chills.  Exam is unremarkable.  He is euthermic year and looks well, no acute distress and hemodynamically stable.  Suspect he still having symptoms due to recent para flu virus.  Also no suggestions of respiratory failure compromise at this time.  Advised continued supportive treatment for pain flu at home.  Mother did request to extend his absence from school given that he still having symptoms.  I thought this was reasonable and advised that he was feeling better with no fever on Monday that he could return to school at that time.  Advised to  follow-up with PCP.  Discussed return precautions.  Discharged in good condition.        Final Clinical Impression(s) / ED Diagnoses Final diagnoses:  Chills    Rx / DC Orders ED Discharge Orders     None         Janalee Mcmurray, PA-C 07/02/23 1446    Tegeler, Marine Sia, MD 07/03/23 1735

## 2023-07-04 LAB — CULTURE, BLOOD (ROUTINE X 2)
Culture: NO GROWTH
Culture: NO GROWTH
Special Requests: ADEQUATE
Special Requests: ADEQUATE
